# Patient Record
Sex: Male | Born: 1940 | Race: White | Hispanic: No | Marital: Single | Smoking: Current every day smoker
Health system: Southern US, Community
[De-identification: ages and names within clinical notes are randomized; demographics above are authoritative.]

---

## 2002-11-21 ENCOUNTER — Ambulatory Visit (HOSPITAL_COMMUNITY): Admission: RE | Admit: 2002-11-21 | Discharge: 2002-11-21 | Payer: Self-pay

## 2006-08-04 ENCOUNTER — Ambulatory Visit: Payer: Self-pay | Admitting: Internal Medicine

## 2006-08-04 ENCOUNTER — Inpatient Hospital Stay (HOSPITAL_COMMUNITY): Admission: EM | Admit: 2006-08-04 | Discharge: 2006-08-09 | Payer: Self-pay | Admitting: Emergency Medicine

## 2006-08-09 ENCOUNTER — Encounter (INDEPENDENT_AMBULATORY_CARE_PROVIDER_SITE_OTHER): Payer: Self-pay | Admitting: *Deleted

## 2006-08-09 ENCOUNTER — Ambulatory Visit: Payer: Self-pay | Admitting: Gastroenterology

## 2007-05-08 ENCOUNTER — Encounter: Admission: RE | Admit: 2007-05-08 | Discharge: 2007-06-19 | Payer: Self-pay | Admitting: Neurosurgery

## 2008-06-06 ENCOUNTER — Emergency Department (HOSPITAL_COMMUNITY): Admission: EM | Admit: 2008-06-06 | Discharge: 2008-06-06 | Payer: Self-pay | Admitting: Emergency Medicine

## 2008-06-28 ENCOUNTER — Encounter (INDEPENDENT_AMBULATORY_CARE_PROVIDER_SITE_OTHER): Payer: Self-pay | Admitting: Surgery

## 2008-06-29 ENCOUNTER — Encounter (INDEPENDENT_AMBULATORY_CARE_PROVIDER_SITE_OTHER): Payer: Self-pay | Admitting: *Deleted

## 2008-06-29 ENCOUNTER — Observation Stay (HOSPITAL_COMMUNITY): Admission: EM | Admit: 2008-06-29 | Discharge: 2008-06-30 | Payer: Self-pay | Admitting: Emergency Medicine

## 2009-04-10 ENCOUNTER — Encounter: Admission: RE | Admit: 2009-04-10 | Discharge: 2009-04-10 | Payer: Self-pay | Admitting: Neurosurgery

## 2009-09-30 ENCOUNTER — Encounter
Admission: RE | Admit: 2009-09-30 | Discharge: 2009-12-29 | Payer: Self-pay | Admitting: Physical Medicine & Rehabilitation

## 2009-10-06 ENCOUNTER — Ambulatory Visit: Payer: Self-pay | Admitting: Physical Medicine & Rehabilitation

## 2009-10-09 ENCOUNTER — Encounter
Admission: RE | Admit: 2009-10-09 | Discharge: 2010-01-05 | Payer: Self-pay | Admitting: Physical Medicine & Rehabilitation

## 2009-10-29 ENCOUNTER — Ambulatory Visit: Payer: Self-pay | Admitting: Physical Medicine & Rehabilitation

## 2009-11-08 ENCOUNTER — Ambulatory Visit (HOSPITAL_COMMUNITY)
Admission: RE | Admit: 2009-11-08 | Discharge: 2009-11-08 | Payer: Self-pay | Admitting: Physical Medicine & Rehabilitation

## 2009-12-03 ENCOUNTER — Ambulatory Visit: Payer: Self-pay | Admitting: Physical Medicine & Rehabilitation

## 2009-12-10 ENCOUNTER — Ambulatory Visit: Payer: Self-pay | Admitting: Physical Medicine & Rehabilitation

## 2009-12-16 ENCOUNTER — Encounter
Admission: RE | Admit: 2009-12-16 | Discharge: 2009-12-18 | Payer: Self-pay | Source: Home / Self Care | Admitting: Physical Medicine & Rehabilitation

## 2009-12-18 ENCOUNTER — Ambulatory Visit: Payer: Self-pay | Admitting: Physical Medicine & Rehabilitation

## 2010-01-08 ENCOUNTER — Encounter
Admission: RE | Admit: 2010-01-08 | Discharge: 2010-03-13 | Payer: Self-pay | Source: Home / Self Care | Attending: Physical Medicine & Rehabilitation | Admitting: Physical Medicine & Rehabilitation

## 2010-01-16 ENCOUNTER — Ambulatory Visit: Payer: Self-pay | Admitting: Physical Medicine & Rehabilitation

## 2010-03-03 ENCOUNTER — Encounter
Admission: RE | Admit: 2010-03-03 | Discharge: 2010-04-02 | Payer: Self-pay | Source: Home / Self Care | Attending: Physical Medicine & Rehabilitation | Admitting: Physical Medicine & Rehabilitation

## 2010-03-13 ENCOUNTER — Ambulatory Visit: Payer: Self-pay | Admitting: Physical Medicine & Rehabilitation

## 2010-04-12 ENCOUNTER — Encounter
Admission: RE | Admit: 2010-04-12 | Payer: Self-pay | Source: Home / Self Care | Admitting: Physical Medicine & Rehabilitation

## 2010-04-13 ENCOUNTER — Encounter
Admission: RE | Admit: 2010-04-13 | Discharge: 2010-05-12 | Payer: Self-pay | Source: Home / Self Care | Attending: Physical Medicine & Rehabilitation | Admitting: Physical Medicine & Rehabilitation

## 2010-04-14 ENCOUNTER — Ambulatory Visit
Admission: RE | Admit: 2010-04-14 | Discharge: 2010-04-14 | Payer: Self-pay | Source: Home / Self Care | Attending: Physical Medicine & Rehabilitation | Admitting: Physical Medicine & Rehabilitation

## 2010-05-14 ENCOUNTER — Encounter: Payer: Medicare Other | Attending: Physical Medicine & Rehabilitation

## 2010-05-14 ENCOUNTER — Ambulatory Visit: Admit: 2010-05-14 | Payer: Self-pay | Admitting: Physical Medicine & Rehabilitation

## 2010-05-14 ENCOUNTER — Ambulatory Visit: Payer: Medicare Other

## 2010-05-14 ENCOUNTER — Encounter: Payer: Medicare Other | Admitting: Physical Medicine & Rehabilitation

## 2010-05-14 DIAGNOSIS — M545 Low back pain, unspecified: Secondary | ICD-10-CM | POA: Insufficient documentation

## 2010-05-14 DIAGNOSIS — M47817 Spondylosis without myelopathy or radiculopathy, lumbosacral region: Secondary | ICD-10-CM | POA: Insufficient documentation

## 2010-06-24 ENCOUNTER — Ambulatory Visit: Payer: Medicare Other | Admitting: Physical Medicine & Rehabilitation

## 2010-06-24 ENCOUNTER — Encounter: Payer: Medicare Other | Attending: Physical Medicine & Rehabilitation

## 2010-06-24 DIAGNOSIS — M19019 Primary osteoarthritis, unspecified shoulder: Secondary | ICD-10-CM

## 2010-06-24 DIAGNOSIS — M545 Low back pain, unspecified: Secondary | ICD-10-CM | POA: Insufficient documentation

## 2010-06-24 DIAGNOSIS — M542 Cervicalgia: Secondary | ICD-10-CM

## 2010-06-24 DIAGNOSIS — M47817 Spondylosis without myelopathy or radiculopathy, lumbosacral region: Secondary | ICD-10-CM | POA: Insufficient documentation

## 2010-06-24 DIAGNOSIS — M7530 Calcific tendinitis of unspecified shoulder: Secondary | ICD-10-CM

## 2010-06-24 DIAGNOSIS — M5380 Other specified dorsopathies, site unspecified: Secondary | ICD-10-CM

## 2010-07-23 LAB — APTT: aPTT: 30 seconds (ref 24–37)

## 2010-07-23 LAB — BASIC METABOLIC PANEL
BUN: 5 mg/dL — ABNORMAL LOW (ref 6–23)
CO2: 26 mEq/L (ref 19–32)
Calcium: 8.8 mg/dL (ref 8.4–10.5)
Chloride: 102 mEq/L (ref 96–112)
GFR calc non Af Amer: >60 mL/min (ref >60)
Glucose, Bld: 171 mg/dL — ABNORMAL HIGH (ref 70–99)

## 2010-07-23 LAB — DIFFERENTIAL
Basophils Absolute: 0.2 K/uL — ABNORMAL HIGH (ref 0.0–0.1)
Basophils Relative: 2 % — ABNORMAL HIGH (ref 0–1)
Eosinophils Absolute: 0.4 K/uL (ref 0.0–0.7)
Eosinophils Relative: 3 % (ref 0–5)
Lymphocytes Relative: 13 % (ref 12–46)
Lymphs Abs: 1.6 10*3/uL (ref 0.7–4.0)
Monocytes Absolute: 1 10*3/uL (ref 0.1–1.0)
Monocytes Relative: 8 % (ref 3–12)
Neutro Abs: 8.7 K/uL — ABNORMAL HIGH (ref 1.7–7.7)
Neutrophils Relative %: 73 % (ref 43–77)

## 2010-07-23 LAB — BASIC METABOLIC PANEL WITH GFR
Creatinine, Ser: 0.76 mg/dL (ref 0.4–1.5)
GFR calc Af Amer: >60 mL/min (ref >60)
Potassium: 4.6 meq/L (ref 3.5–5.1)
Sodium: 133 meq/L — ABNORMAL LOW (ref 135–145)

## 2010-07-23 LAB — CBC
HCT: 36.2 % — ABNORMAL LOW (ref 39.0–52.0)
Hemoglobin: 12.8 g/dL — ABNORMAL LOW (ref 13.0–17.0)
MCHC: 35.4 g/dL (ref 30.0–36.0)
MCV: 100.5 fL — ABNORMAL HIGH (ref 78.0–100.0)
Platelets: 303 10*3/uL (ref 150–400)
RBC: 3.6 MIL/uL — ABNORMAL LOW (ref 4.22–5.81)
RDW: 13.8 % (ref 11.5–15.5)
WBC: 11.9 10*3/uL — ABNORMAL HIGH (ref 4.0–10.5)

## 2010-07-23 LAB — PROTIME-INR
INR: 0.9 (ref 0.00–1.49)
Prothrombin Time: 12.2 s (ref 11.6–15.2)

## 2010-08-03 ENCOUNTER — Encounter: Payer: Medicare Other | Attending: Physical Medicine & Rehabilitation

## 2010-08-03 ENCOUNTER — Ambulatory Visit: Payer: Medicare Other | Admitting: Physical Medicine & Rehabilitation

## 2010-08-03 DIAGNOSIS — M47817 Spondylosis without myelopathy or radiculopathy, lumbosacral region: Secondary | ICD-10-CM | POA: Insufficient documentation

## 2010-08-04 NOTE — Procedures (Signed)
Andres Cochran, UMSCHEID              ACCOUNT NO.:  [id number]  MEDICAL RECORD NO.:  [id number]           PATIENT TYPE:  O  LOCATION:  [facility]PC                          FACILITY:  [facility]  PHYSICIAN:  Erick Colace, M.D.DATE OF BIRTH:  Aug 14, 1940  DATE OF PROCEDURE:  08/03/2010 DATE OF DISCHARGE:                              OPERATIVE REPORT  PROCEDURE:  left L3 and left L4 medial branch blocks and left L5 dorsal ramus injection to block the L4-5, L5-S1 facet joints under fluoroscopic guidance.  INDICATION:  Lumbar spondylosis with very good relief following right- sided medial branch blocks May 31, 2010, now here for left-sided injections.  Informed consent was obtained after describing the risks and benefits of the procedure with the patient.  These include bleeding, bruising, and infection.  The patient has failed conservative care including medication management and elects to proceed.  The patient was placed prone on fluoroscopy table.  Betadine prep, sterile drape, 25-gauge inch and half needle was used to anesthetize the skin and subcutaneous tissue with 1% lidocaine x2 mL at each of 3 sites and a 22-gauge 3-1/2-inch spinal needle was inserted under fluoroscopic guidance starting at left S1 SAP sacral ala junction, bone contact made. Omnipaque 180 x0.5 mL demonstrated no intravascular uptake, then 0.5 mL of dexamethasone and lidocaine solution was injected.  Then, left L5 SAP transverse process junction targeted, bone contact made.  Omnipaque 180 x0.5 mL demonstrated no intravascular uptake, then 0.5 end of solution containing 1 mL of 4 mg/mL dexamethasone and 2 mL of 2% lidocaine were injected.  The left L4 SAP transverse process junction targeted, bone contact made.  Omnipaque 180 x0.5 mL demonstrated no intravascular uptake, then 0.5 mL of dexamethasone and lidocaine solution was injected.  The patient tolerated the procedure well.  Pre and postinjection vitals stable.   Postinjection structures were given. Preinjection pain level 8 and postinjection 4/10.  The patient tolerated the procedure well.  Postprocedure vitals stable.  Follow up with Dr. Riley Kill in 1 month.     Erick Colace, M.D. Electronically Signed    AEK/MEDQ  D:  08/03/2010 14:16:06  T:  08/04/2010 01:10:00  Job:  [id number]

## 2010-08-10 NOTE — Assessment & Plan Note (Signed)
Andres Cochran is back regarding his lumbar facet arthropathy and general back pain.  He had good results on the right side of the L4-L5, L5-S1 medial branch blocks.  He is having more left-sided pain now.  He woke up other day with the back tight and catching.  He rested for a couple of days and felt a bit better but still having more left-sided symptoms when he stands.  It hurts him to bear weight sometimes on the left side as well. Pain is 4-6/10.  He is using Percocet for breakthrough pain.  He used "a few extra." His pain increased.  Pain is sharp burning, tingling, aching.  Pain interferes with general activity, relations with others, enjoyment of life on a moderate level.  REVIEW OF SYSTEMS:  Notable for the above.  He still has persistent weight loss and his family doctor has not found the reason for this yet. Appetite is poor at times.  He has some night sweats, elevated sugars. Full 12 point review is in the written health history section of the chart.  SOCIAL HISTORY:  Unchanged, living with his sisters and he is still smoking.  PHYSICAL EXAMINATION:  VITAL SIGNS:  Blood pressure is 153/84, pulse 96, respiratory rate 18, satting 99% on room air. GENERAL:  The patient is pleasant, alert and oriented x3. NEUROLOGIC:  Affect showing bright and appropriate.  He states he had some pain on the left side and some mild antalgia with left weightbearing.  He had pain with extension, lateral bending, and facet maneuvers were positive on the right much more on the left.  He is able to really comfortably flex today to 90 degrees.  Strength is 5/5 with some distal sensory loss in the legs.  He continues to be quite small in build and it is hard to discern whether he lost any further weight quite frankly.  He otherwise is alert and appropriate.  ASSESSMENT: 1. Lumbar spondylosis with S1 radiculitis and L5 radiculitis in the     right.  This has improved. 2. Lumbar facet arthropathy, right  greater than left at L4-L5, L5-S1.     The patient with excellent results after right medial branch     blocks. 3. Greater trochanter bursitis. 4. Diabetes. 5. History of insomnia, tobacco abuse, and caffeine abuse.  PLAN: 1. We will send the patient for medial branch blocks L4, L5-S1 on the     left per Dr. Fritzi Mandes as available.  Again we stressed the     importance of appropriate posture, quad muscle strengthening,     lengthening, etc. 2. We will add Robaxin p.r.n. for spasms and I think he occasionally     he is having breakthrough spasm symptoms. 3. I refilled his Percocet today 5/325 one q.6 h. #90. 4. I will see him back pending injections.     Ranelle Oyster, M.D. Electronically Signed    ZTS/MedQ D:  06/24/2010 10:11:19  T:  06/24/2010 13:14:06  Job #:  [id number]  cc:   Raynelle Jan, M.D. Fax: [phone]  Clydene Fake, M.D. Fax: [phone]

## 2010-08-14 ENCOUNTER — Encounter: Payer: Medicare Other | Attending: Physical Medicine & Rehabilitation | Admitting: Neurosurgery

## 2010-08-14 DIAGNOSIS — M47817 Spondylosis without myelopathy or radiculopathy, lumbosacral region: Secondary | ICD-10-CM | POA: Insufficient documentation

## 2010-08-14 DIAGNOSIS — M48061 Spinal stenosis, lumbar region without neurogenic claudication: Secondary | ICD-10-CM

## 2010-08-14 DIAGNOSIS — G894 Chronic pain syndrome: Secondary | ICD-10-CM

## 2010-08-14 DIAGNOSIS — M545 Low back pain, unspecified: Secondary | ICD-10-CM | POA: Insufficient documentation

## 2010-08-14 DIAGNOSIS — M5430 Sciatica, unspecified side: Secondary | ICD-10-CM

## 2010-08-15 NOTE — Assessment & Plan Note (Signed)
Mr. Ingle is a patient of Dr. Faith Rogue that he has followed for sometime for low back pain.  He has most recently been seen by Dr. Wynn Banker for left L3-4 medial branch blocks and the left L5 dorsal ramus injection that was completed on August 02, 2009.  The patient comes in today.  Pain complaints on an average of 10.  It is sharp, burning, dull, stabbing, tingling, aching, and some constant pain.  He states pain has been worse since he got the injection.  He reports low back pain with left radicular pain.  The pain is worse with all activities.  Heat, rest, therapy, exercise, medications, and injections tend to help, but he states the injections he feels like made it worse this past time.  The patient's mobility is poor.  He does walk without assistance.  He has a cane at times, he states, but not today. Does have wounds on his arms from falling.  He lives alone.  He does most of his own ADLs.  He is on disability.  REVIEW OF SYSTEMS:  Notable for skin rash and breakdown, easy bleeding, high blood sugars, night sweats, painful urination, constipation, poor appetite, and limb swelling.  He also has numbness, trouble with ambulation, confusion, and depression from time to time.  The patient's blood pressure today is 123/80, his pulse was 88, respirations were 24, his O2 sat was 91 on room air.  His motor strength appears about 4/5 in the lower extremities bilaterally.  Sensation is intact to pinprick.  He appears with somewhat depressed.  He is alert and oriented x3.  Constitutionally, he is within normal limits.  ASSESSMENT: 1. Lumbar stenosis. 2. Chronic low back pain. 3. Status post medial branch blocks and dorsal ramus injections     without positive affect.  PLAN: 1. He will follow up as scheduled with Dr. Riley Kill on Sep 02, 2010. 2. The patient did not bring his pills with him today that he did     retrieve bottle which was empty.  He is 7 days early for his  refill. 3. I did give him prescription for oxycodone 5/325 one p.o. q.6 h.     p.r.n. pain, #90 with no refill, cannot get it refilled until Aug 21, 2010.  He understands this. 4. Otherwise, there are no signs of aberrant behavior and again he     will follow up with Dr. Riley Kill on Sep 01, 2010, or Sep 02, 2010, as     scheduled.     Briggs Edelen L. Blima Dessert    RLW/MedQ D:  08/14/2010 14:40:23  T:  08/15/2010 02:15:55  Job #:  [id number]

## 2010-08-17 ENCOUNTER — Emergency Department (HOSPITAL_COMMUNITY): Payer: Medicare Other

## 2010-08-17 ENCOUNTER — Emergency Department (HOSPITAL_COMMUNITY)
Admission: EM | Admit: 2010-08-17 | Discharge: 2010-08-17 | Discharge status: Against Medical Advice | Payer: Medicare Other | Attending: Emergency Medicine | Admitting: Emergency Medicine

## 2010-08-17 DIAGNOSIS — Z043 Encounter for examination and observation following other accident: Secondary | ICD-10-CM | POA: Insufficient documentation

## 2010-08-17 LAB — BASIC METABOLIC PANEL
BUN: 10 mg/dL (ref 6–23)
CO2: 26 mEq/L (ref 19–32)
Calcium: 8.5 mg/dL (ref 8.4–10.5)
Potassium: 4.4 mEq/L (ref 3.5–5.1)

## 2010-08-17 LAB — CBC
Platelets: 231 10*3/uL (ref 150–400)
RBC: 3.52 MIL/uL — ABNORMAL LOW (ref 4.22–5.81)
RDW: 13.6 % (ref 11.5–15.5)
WBC: 9.7 10*3/uL (ref 4.0–10.5)

## 2010-08-17 LAB — DIFFERENTIAL
Basophils Absolute: 0.1 10*3/uL (ref 0.0–0.1)
Basophils Relative: 1 % (ref 0–1)
Eosinophils Absolute: 0.8 10*3/uL — ABNORMAL HIGH (ref 0.0–0.7)
Lymphocytes Relative: 26 % (ref 12–46)
Monocytes Relative: 9 % (ref 3–12)
Neutro Abs: 5.5 10*3/uL (ref 1.7–7.7)
Neutrophils Relative %: 56 % (ref 43–77)
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
## 2010-08-25 NOTE — Op Note (Signed)
NAMEARMARION, GREEK NO.:  [id number]   MEDICAL RECORD NO.:  [id number]          PATIENT TYPE:  OBV   LOCATION:  1524                         [facility]ACILITY[facility]   PHYSICIAN:  Ardeth Sportsman, MD     DATE OF BIRTH:  1940-11-12   DATE OF PROCEDURE:  DATE OF DISCHARGE:                               OPERATIVE REPORT   PRIMARY CARE PHYSICIAN:  Dr. Joetta Manners near in [place], [place].   GASTROENTEROLOGIST:  Dr. Lynann Bologna.   SURGEON:  Ardeth Sportsman, MD.   PREOPERATIVE DIAGNOSIS:  Prolapsed necrotic hemorrhoids.   POSTOPERATIVE DIAGNOSES:  1. Right posterior prolapsed necrotic internal and external      hemorrhoid.  2. Left lateral prolapsing non-necrotic internal hemorrhoid.   PROCEDURE PERFORMED:  Examination under anesthesia  Hemorrhoidectomy x2 (right posterior and left lateral).   ANESTHESIA:  1. General anesthesia.  2. Bilateral anorectal block using quarter-percent bupivacaine.   DRAINS:  None.   ESTIMATED BLOOD LOSS:  30 mL.   COMPLICATIONS:  None apparent.   INDICATIONS:  Mr. Nevins is a 70 year old gentleman with numerous health  issues.  He has been struggling with chronic hemorrhoids and had a  hemorrhoidectomy in the past.  He had a recent colonoscopy which was  unable to be completed, and starting having worsening pain and swelling  and prolapsing of his hemorrhoids.  He had, had intermittent prolapsing  for the past year, but after the colonoscopy they came out and stayed  out and were severely tender and uncomfortable.  It came to the point of  being unbearable and he could no longer reduce it and, therefore, he  came to the emergency room.  Dr. Oletta Lamas requested surgical consultation  given the fact that it was large and out, extremely painful and not able  to be reduced with some evident necrosis.  Recommendation was made for  examination under anesthesia.  The risks, benefits and alternatives were  discussed.  The technique of  removal of hemorrhoids was discussed.  Questions answered and he and his family agreed to proceed.   OPERATIVE FINDINGS:  He had a right posterior extremely large hemorrhoid  with an internal and external component with a wide basethat was  prolapsed and strangulated.  He had a smaller, but definite left lateral  prolapsing hemorrhoid as well.  He had normal sphincter tone without  stricture.  He had no obvious rectal masses.   DESCRIPTION OF PROCEDURE:  Informed consent was confirmed.  The patient  received IV cefoxitin given the urgent nature of the case.  He had  sequential compression devices active during the entire case.  He  underwent general anesthesia without any difficulty.  He was positioned  in high lithotomy.  He had a Foley catheter sterilely placed.  His  perineum and perianal region were prepped and draped in a sterile  fashion.   Digital examination revealed findings noted above with obvious large  prolapsing hemorrhoid in the right posterior aspect.  He had a more  moderate one on the left lateral aspect.  There was no evidence of any  major prolapsing right anteriorly.  I was able to carefully dilate two  fingers up and get an anoscope in.  Therefore, I used cautery dissection  to excise the large right posterior hemorrhoid, taking care to avoid  taking too wide of a margin right at the anal sphincter level.  I ended  up reapproximating the defect using interrupted figure-of-eight chromic  stitches horizontally to the rectum to the anal skin edge to good  result.   Re-examination revealed the obvious prolapsing hemorrhoid on the left  lateral side.  I went ahead and put a 2-0 chromic stitch at the apex of  the hermorrhoid, excised that with cautery and closed it in a running  fashion with a more classic longitudinal fashion to good result.  Hemostasis was excellent.  There was an internal component.  I did not  have to excise any external region.   Digital  examination revealed intact sphincter and there was no evidence  of any stenosis.  Hemostasis was excellent.  I placed a B and O  suppository and a large Gelfoam within the anal region.  The patient was  extubated and sent to the recovery room in stable condition.   There was no family to discuss postoperative care at this time, but I  will try and locate them later this morning since it is the middle of  the night.  Postop instructions were discussed and are on the chart as  well, and I will follow-up with the patient later.      Ardeth Sportsman, MD  Electronically Signed     SCG/MEDQ  D:  06/29/2008  T:  06/29/2008  Job:  [id number]   cc:   Raynelle Jan, M.D.  Fax: [phone]   Lynann Bologna  Fax: [phone]

## 2010-08-28 NOTE — Discharge Summary (Signed)
NAMESALMAN, WELLEN NO.:  [id number]   MEDICAL RECORD NO.:  [id number]          PATIENT TYPE:  INP   LOCATION:  5525                         FACILITY:  [facility]   PHYSICIAN:  Alvester Morin, M.D.  DATE OF BIRTH:  1940-07-28   DATE OF ADMISSION:  08/04/2006  DATE OF DISCHARGE:  08/09/2006                               DISCHARGE SUMMARY   DISCHARGE DIAGNOSES:  1. Abdominal pain secondary to chronic pancreatitis.  2. History of peptic ulcer disease.  3. History of acalculous cholecystitis.  4. Diabetes mellitus, type 2.  5. 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  6. History of pancreatic ductal dilatation.  7. History of alcohol abuse.  8. Tobacco abuse.  9. Gastroesophageal reflux disease.   DISCHARGE MEDICATIONS:  1. Pancrease/Creon, take 2 capsules p.o. three times a day with and/or      before meals.  2. Amaryl 2 mg one tablet p.o. daily.  3. Percocet 5/325, 1-2 tablets p.o. q.6h. p.r.n. pain, dispense 75      with no refills.  4. Wellbutrin 150 mg p.o. once daily.  5. Prilosec 20 mg one tablet p.o. daily.   DISPOSITION AND FOLLOW UP:  Mr. Manna is being discharged from [facility] in stable condition.  He continues to have episodic  periods of abdominal pain which are consistent with his chronic  abdominal pain.  His pain is greatly reduced, and he has been  consistently able to take p.o.  He is to follow up first with his  primary care physician, Dr. Carolyne Fiscal, on Thursday, Aug 11, 2006 at 9:45 a.m.  At that point, Dr. Carolyne Fiscal can evaluate the patient's abdominal pain, as  well as his current pain control.  Again, I am discharging with no  refills for some Percocet tablets, 75 given.  I have also instructed the  patient to vary the timing of his Pancrease self administration in order  to maximize its benefits to limit his post prandial abdominal pain.  In  addition, she will continue to follow him for his chronic medical  conditions including his chronic pancreatitis, diabetes and alcoholism.  In addition, the patient has been discharged with a starting dose of  Wellbutrin 150 mg once daily for smoking cessation reasons.  This can be  titrated up to b.i.d. during that visit with Dr. Carolyne Fiscal.   Following this, the patient will be evaluated by Dr. Lindie Spruce of [facility].  The patient is very interested in having a  cholecystectomy.  We did not think that this was warranted as an  inpatient in the setting of acute-on-chronic pancreatitis; however,  I  will defer to Dr. Lindie Spruce in terms of the patient's suitability for  cholecystectomy.  Also to Dr. Lindie Spruce, I am faxing over a large amount of  the patient's past medical records from [facility] and several of  his pertinent imaging findings.  I will also describe a summary of them  below.   PROCEDURES PERFORMED:  1. Acute abdominal series the day of admission revealed a nonspecific      bowel gas  pattern with a mild gaseous distention of the small bowel      and colon.  It also picked up COPD changes in the lung.  2. Abdominal ultrasound revealed a sludge ball in the gallbladder.  A      common duct dilatation of 10 mm with high suspicion for a 4 mm      distal common duct stone.  The pancreas and spleen were      suboptimally visualized.  3. MRCP showed mild pancreatic ductal dilatation and dilatation of the      pancreatic duct side branches consistent with chronic pancreatitis.      A smoothly tapered stricture of the distal enteropancreatic portion      of the common bile duct, likely due to chronic pancreatitis.      Proximal dilatation of the common bile duct measuring 11 mm.  No      evidence of choledocholithiasis or pancreatic mass.   CONSULTATIONS:  Dr. Russella Dar of gastroenterology.   BRIEF ADMISSION HISTORY AND PHYSICAL:  Mr. Cassetta is a 70 year old man  with a history of recurrent chronic pancreatitis, diabetes secondary to   pancreatitis and history of alcoholism who presents with abdominal pain.  The pain had started 5 days prior to admission and was 4/10 in nature,  dull, midline and fairly stable.  He did not seek any medical help;  however, 1 day prior to admission the pain became severe, approximately  10/10, sharp, all over the abdomen, associated with nausea and vomiting  approximately 7-8 days with non-bloody emesis.  It is similar to his  previous episodes of pancreatitis, he reports.  He denied fever, chills  or jaundice.  He complained of some weight loss, approximately 10 pounds  over the prior 5-6 months.  He had apparently been seen at [facility] in February of 2008 for recurrent pancreatitis with an  extensive work-up.   ALLERGIES:  CODEINE which causes a rash.   PAST MEDICAL HISTORY:  1. Chronic pancreatitis.  Has had at least 10 exacerbations in the      past with the most recent being in February of 2008 and then April      of 2008.  He has had multiple CTs which revealed intra and      extrahepatic, as well as intrapancreatic ductal dilatation.      Apparently, he did have some calcifications and other stigmata of      chronic pancreatitis visualized, as well.  At one point in February      of 2008, a 1.8 cm pancreatic pseudocyst had been visualized.  Then      in March of 2008, a [facility] CT revealed detailed cystic      components compared with the prior exam and continued prominence of      the pancreatic duct.  2. Cholecystitis.  He has apparently had 2 ultrasound and MRCPs in the      past, apparently in February of 2008.  This was consistent with      acalculous cholecystitis with no definite stones.  The common bile      duct was noted to be dilated in February of 2008 to 8-9 mm.  He      underwent MRCP on May 21, 2006 at [place] that showed      pancreatic enlargement with common bile duct dilatation to 11 mm,     with a stricture versus extrinsic  compression of the common bile  duct within the pancreatic head.  Again, in February of 2008, he      underwent ultrasound and MRCP here at [facility] as described above      under procedures.  3. Peptic ulcer disease.  The patient has undergone at least 3 EGDs      that were documented in the past with coagulation of ulcers in the      past.  In January of 2006, he was noted to have a small hiatal      hernia with moderate gastritis.  In May of 2007 at [place], again      they visualized a hiatal hernia with no recurrent ulcers.  He had a      deformed pylorus suggestive of former peptic ulcer disease.   ADMISSION PHYSICAL EXAMINATION:  VITAL SIGNS:  Temperature of 97.6,  blood pressure 133/78, heart rate of 90, respiratory rate of 18, O2  saturations of 98-100% on room air.  GENERAL:  This is a very thin, pale, elderly gentleman who is alert, in  mild distress.  HEENT:  Eyes - pupils equal, round and reactive to light.  Extraocular  muscles intact.  Sclerae were anicteric.  Oropharynx was dry.  NECK:  Supple with no JVD.  LUNGS:  Clear to auscultation bilaterally with no wheezing, rhonchi or  rales.  CARDIOVASCULAR:  Regular rate and rhythm.  S1 and S2.  No murmurs, rubs,  or gallops appreciated.  ABDOMEN:  He had active bowel sounds, diffusely tender with active  voluntary guarding.  He has right upper quadrant tenderness; no rebound  and no organomegaly appreciated.  EXTREMITIES:  No edema.  SKIN:  Diffusely dry.  NEUROLOGIC:  He was alert and oriented x3.  He had no focal deficits in  strength or sensation.  PSYCHIATRIC:  He was appropriate.   LABORATORY DATA:  Admission labs revealed a sodium of 131, potassium  4.1, chloride 95, bicarbonate 26, BUN 10, creatinine 0.82, glucose of  117, white count of 13.8, hemoglobin of 13.7, platelets of 370,  bilirubin of 0.6, alkaline phosphatase of 77, AST 21, ALT 11, protein  7.6, albumin of 3.9, calcium of 9.4.  A urinalysis was  negative.  A  lipase was 35.  We did an abdominal ultrasound and plain film as  described above.   HOSPITAL COURSE:  1. Abdominal pain secondary to acute-on-chronic pancreatitis.  The      patient was admitted for further work-up of his abdominal pain.      The ultrasound suggested the possibility of a common bile duct      stone.  However, GI consultation weighed in on the matter and felt      that choledocholithiasis was unlikely as LFT's were normal.  They      felt that his presentation was consistent with chronic pancreatitis      with a normal lipase in the setting of chronic alcohol abuse.  They      did recommend proceeding with an MRCP.  Again, these were with the      results as described above.  The patient was kept NPO during the      aforementioned work-up; however, gradually his pain improved.  He      required decreasing amounts of pain medications.  We slowly      advanced his diet to clear liquids.  He tolerated this moderately     well.  On the morning of discharge, he did have some postprandial  pain from a full clears breakfast; however, he says this is      consistent with his past pain.  At this point, we are discharging      the patient with p.r.n. pain medications, as well as his pancreatic      replacement enzymes, with specific instructions for the patient to      figure out the most efficacious way to administer pancreas (i.e. if      it is 20, 30 or 15 minutes before food or with food or breaking the      capsules over the food).  Again, at this point, he has had a very      thorough GI work-up here, as well as in the past as described      above, and his pain seems consistent with chronic pancreatitis.      however, the patient insists that he thinks something can be done.      For this reason, our medical student, Anabel Halon, who has worked      very thoroughly in following the patient, as well as teasing out      the above-mentioned past medical  history, has set him up to see Dr.      Lindie Spruce of [facility] on Aug 19, 2006.   1. Diabetes mellitus.  The patient is not really clear on how long he      has had diabetes, and was actually somewhat unaware that he had      diabetes.  He felt that Amaryl was a pancreatic medication.      Regardless, we maintained him on Amaryl, which afforded adequate      blood sugar control when the patient was taking p.o. on a regular      basis.  We discharged him on the same to follow up with Dr. Carolyne Fiscal      for further management of his diabetes.   1. Alcoholism.  The patient does seem to be in some form of denial,      feeling that beer was not true alcohol.  Our attending physician,      as well as Anabel Halon, have counseled the patient in regards to      alcohol use.  Anabel Halon has actually also referred him to      Alcoholics Anonymous for further outpatient management of this      problem.   1. Tobacco abuse.  The patient was encouraged and counseled on      quitting.  He was actually requesting a prescription for Wellbutrin      for smoking cessation.  I have provided him with this, and he      should be on 150 mg once daily for 3 days and this can then be      titrated up to b.i.d.  Again, I will refer to Dr. Carolyne Fiscal.   DISCHARGE LABORATORIES AND VITALS:  On the day of discharge, his  temperature was 98, blood pressure 115/66, heart rate 60, respiratory  rate of 18, O2 saturation of 98% on room air.  White blood cell count of  7.8,  hemoglobin 11.6, platelets 283.  Sodium 133, potassium 3.6, chloride  100, bicarbonate 26, BUN 3, creatinine 0.9.  Hemoglobin A1c is 5.8,  alcohol level less than 5.  Urinalysis was negative for infection.  Fecal occult blood was negative.      Antony Contras, M.D.  Electronically Signed      Harriett Sine  Lyn Hollingshead, M.D.  Electronically Signed    GL/MEDQ  D:  08/09/2006  T:  08/09/2006  Job:  [id number]   cc:   Raynelle Jan, M.D.  Cherylynn Ridges, M.D. Venita Lick. Russella Dar, MD, Clementeen Graham

## 2010-09-01 ENCOUNTER — Encounter: Payer: Medicare Other | Attending: Physical Medicine & Rehabilitation | Admitting: Physical Medicine & Rehabilitation

## 2010-09-01 DIAGNOSIS — M542 Cervicalgia: Secondary | ICD-10-CM

## 2010-09-01 DIAGNOSIS — M19019 Primary osteoarthritis, unspecified shoulder: Secondary | ICD-10-CM

## 2010-09-01 DIAGNOSIS — M545 Low back pain, unspecified: Secondary | ICD-10-CM | POA: Insufficient documentation

## 2010-09-01 DIAGNOSIS — M79609 Pain in unspecified limb: Secondary | ICD-10-CM | POA: Insufficient documentation

## 2010-09-01 DIAGNOSIS — M129 Arthropathy, unspecified: Secondary | ICD-10-CM | POA: Insufficient documentation

## 2010-09-01 DIAGNOSIS — IMO0002 Reserved for concepts with insufficient information to code with codable children: Secondary | ICD-10-CM

## 2010-09-01 DIAGNOSIS — M48061 Spinal stenosis, lumbar region without neurogenic claudication: Secondary | ICD-10-CM | POA: Insufficient documentation

## 2010-09-01 DIAGNOSIS — M5380 Other specified dorsopathies, site unspecified: Secondary | ICD-10-CM

## 2010-09-02 NOTE — Assessment & Plan Note (Signed)
Andres Cochran is back regarding his back pain.  Still reporting more pain in the left leg at this point, occasionally the back.  Has a whole of his right leg pain and back pain is improved.  He came back early, see our nurse practitioner with increased pain than the left side.  It seems to bother him a lot at night.  He does not feel that the injections that Dr. Wynn Banker performed which were medial branch blocks on the left or is beneficial as they had been previously.  Pain rates from 6-9/10.  He states that the left leg felt like his right leg.  He would be fairly satisfied of his pain control levels.  REVIEW OF SYSTEMS:  Notable for numbness, trouble walking, spasm, dizziness, tingling, confusion, depression.  He reports constipation, poor appetite, limb swelling, easy bleeding, night sweats.  Full 12- point review is in the written health and history section of the chart.  SOCIAL HISTORY:  Unchanged.  PHYSICAL EXAMINATION:  VITAL SIGNS:  Blood pressure is 150/68, pulse 84, respiratory rate 18, he is satting 99% on room air. GENERAL:  The patient is pleasant, alert, and oriented x3.  He remains slight of build.  He is alert and appropriate. HEART:  Regular rate. CHEST:  Clear. ABDOMEN:  Soft, nontender. MUSCULOSKELETAL:  He has intact sensation to the left leg with positive straight leg raise and seated slump test today.  Back is somewhat tender to palpation over the left lower lumbar region, but really away from the spine into the upper gluteal area.  He has pain more with flexion today than extension, although extends and did cause discomfort, but not radiation of his pain.  Strength is grossly 4-5/5 in the lower extremities with normal sensation.  ASSESSMENT: 1. Lumbar stenosis with facet arthropathy. 2. History of lumbar disk disease with likely radiculopathy.  Symptoms     more predominant in the left than the right leg at this point.  PLAN: 1. The patient did not need pain  medications today.  He was reminded     to comply with the written schedule of his percocet prescription. 2. We will increase Pamelor to 25 mg 1-2 q.h.s. for neuropathic     symptoms of the left leg. 3. We will send the patient to Dr. Wynn Banker for a left paramedian     translaminar injection L4-L5. 4. I will see him back pending the injection above.     Ranelle Oyster, M.D. Electronically Signed    ZTS/MedQ D:  09/01/2010 12:02:24  T:  09/02/2010 01:07:25  Job #:  [id number]

## 2010-09-18 IMAGING — CR DG CERVICAL SPINE FLEX&EXT ONLY
3 series · 3 of 3 positions shown · non-contrast
Comparison: CT scan same day

CLINICAL DATA: Fall.  Pain.

CERVICAL SPINE - FLEXION AND EXTENSION VIEWS ONLY

[w c-spine lat *]
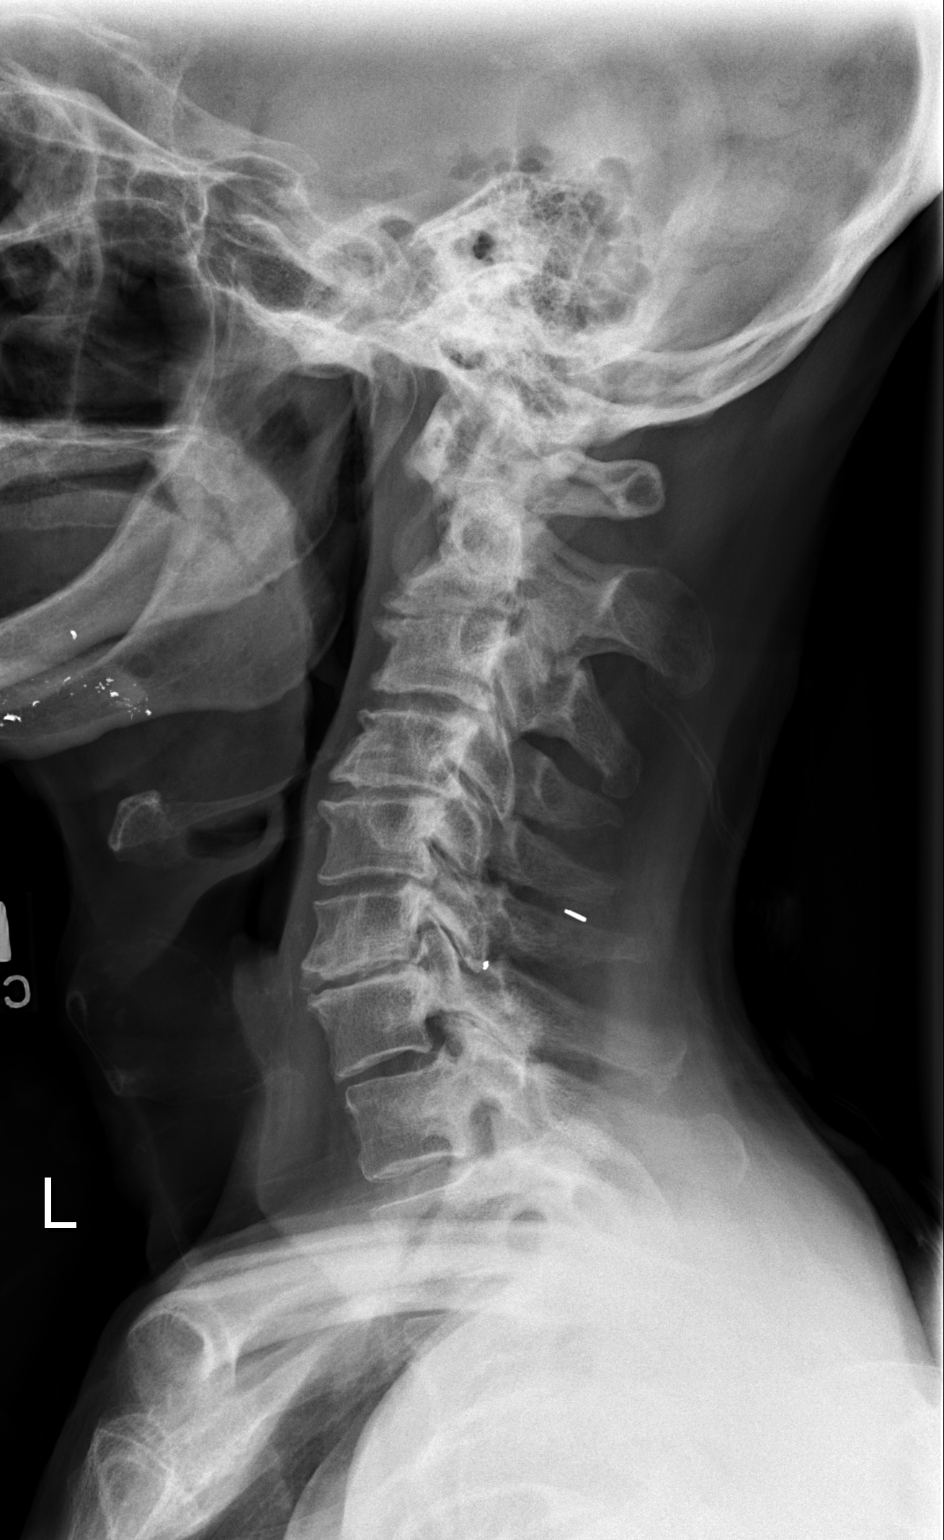

[w c-spine flexion *]
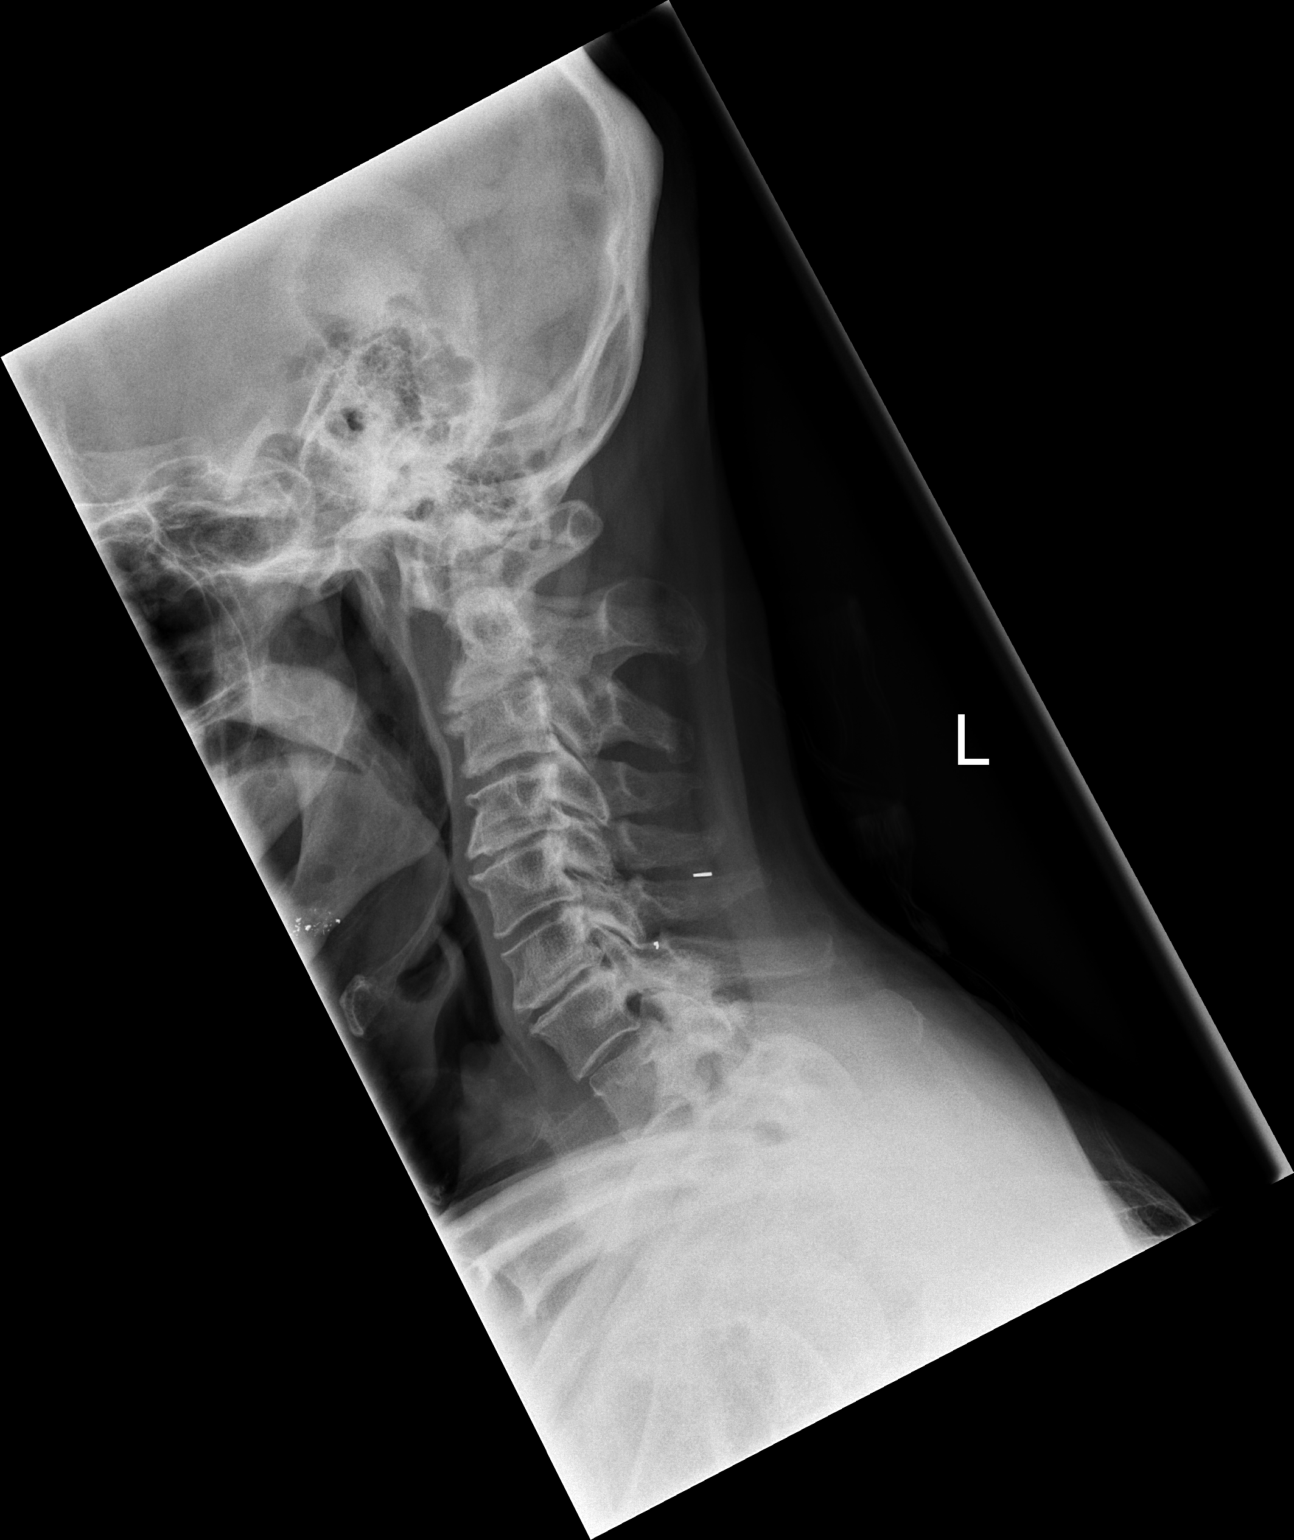

[w c-spine extension *]
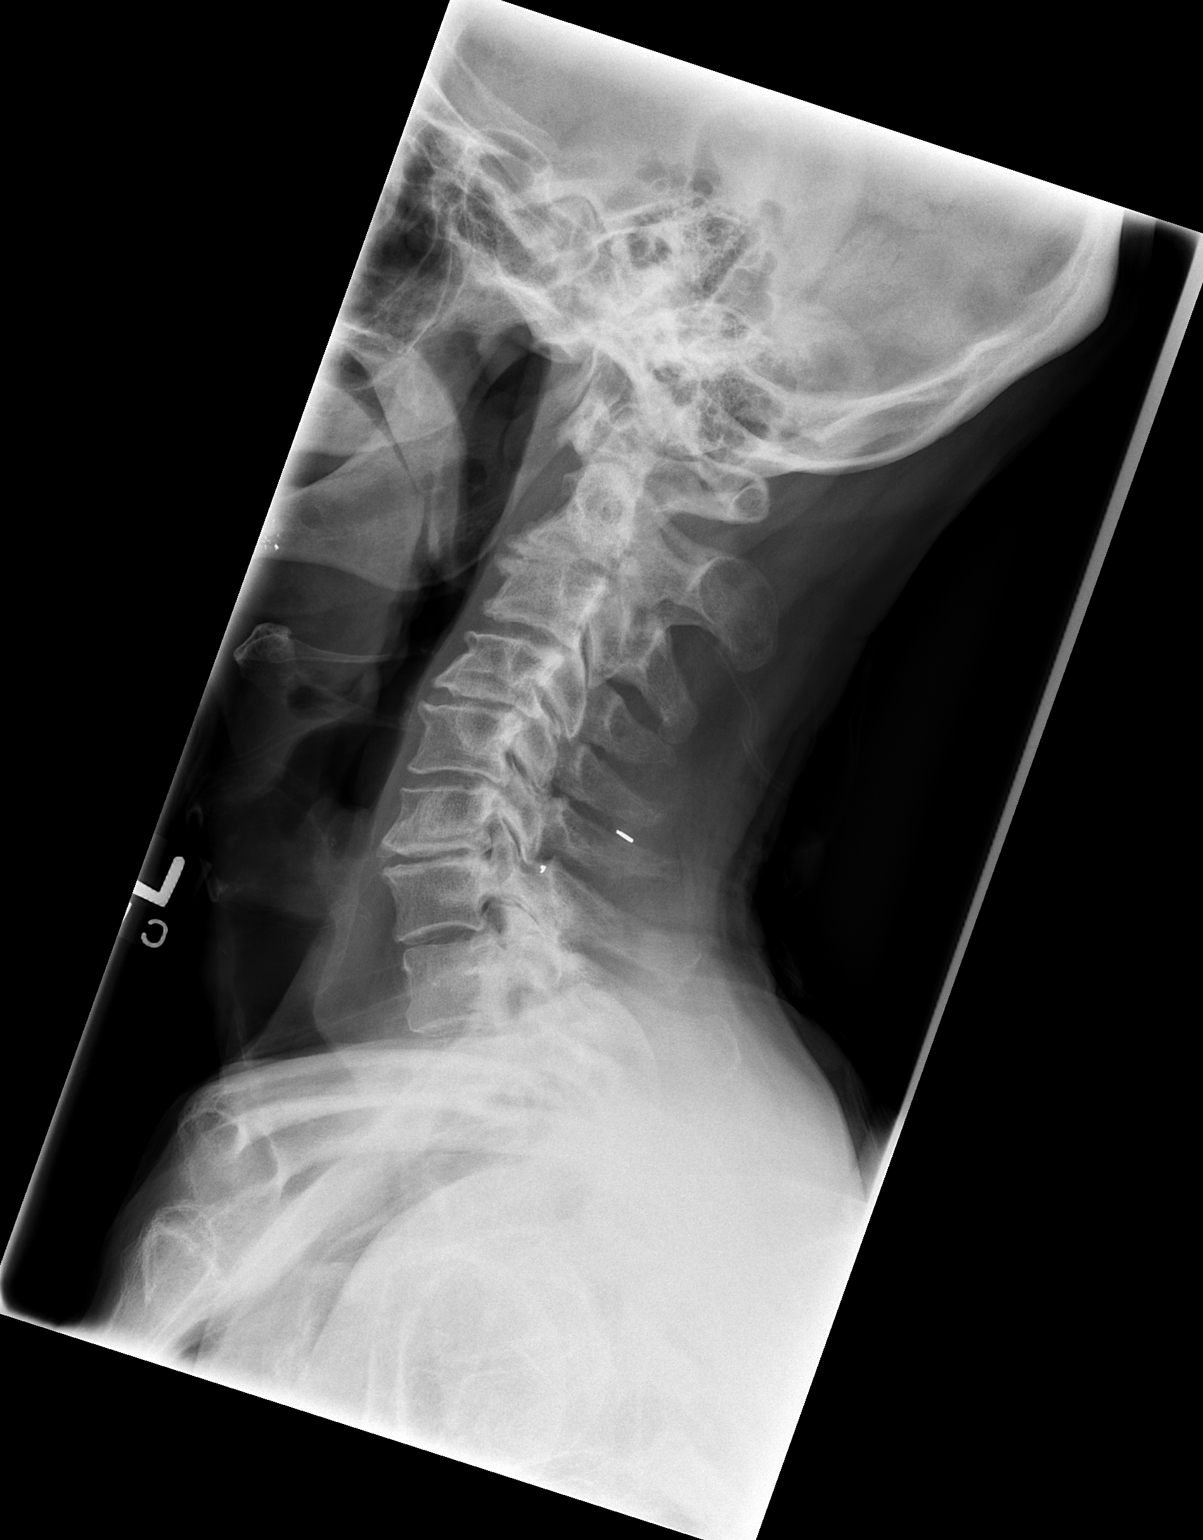

[3 of 3 positions shown; findings below may reference images not displayed]

FINDINGS: Alignment is normal in the neutral position.  Flexion and
extension does not show any subluxation.  No abnormal rocking
motion.  Spondylosis and facet arthropathy again noted.
IMPRESSION: Chronic degenerative changes.  No abnormal motion.

## 2010-09-18 IMAGING — CT CT HEAD W/O CM
2 of 4 series · 13 of 40 positions shown, 16 images · non-contrast
Comparison: None.

CT HEAD

CLINICAL DATA: 67-year-old male status post fall down stairs with
neck pain and posterior headache.

CT HEAD WITHOUT CONTRAST
CT CERVICAL SPINE WITHOUT CONTRAST
TECHNIQUE: Multidetector CT imaging of the head and cervical spine
was performed following the standard protocol without IV contrast.
Multiplanar CT image reconstructions of the cervical spine were
also generated.

[Series 602: <mpr thick range> · coronal · 0.37mm/px · 3 of 43 slices shown]
[im 15/43  brain]
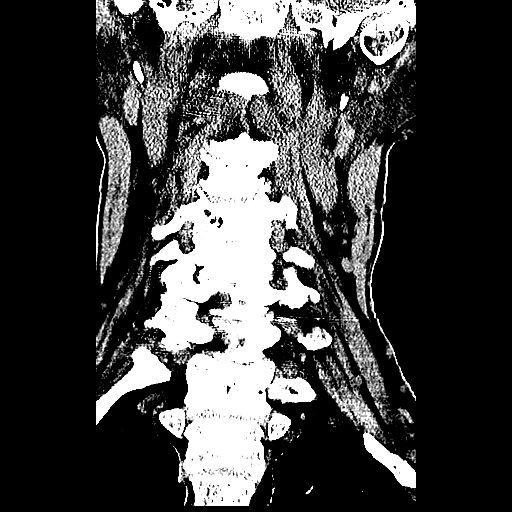
[im 19/43  brain]
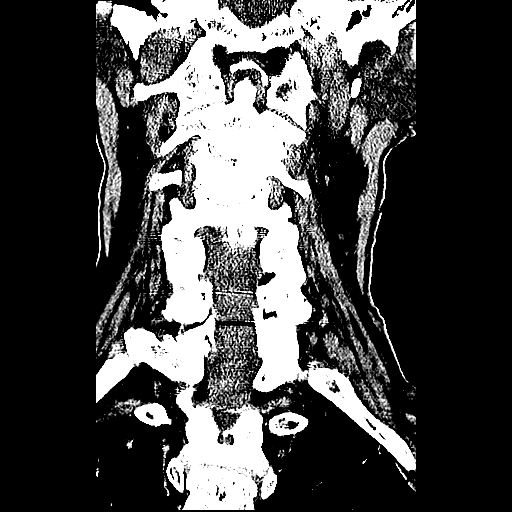
[im 24/43  brain]
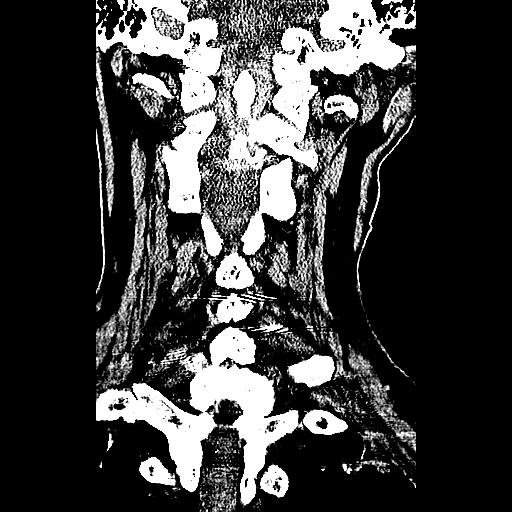

[Series 603: <mpr thick range(1)> · axial · 0.37mm/px · z∈[-324,-177]mm · 10 of 91 slices shown, 13 images]
[im 9/91  brain]
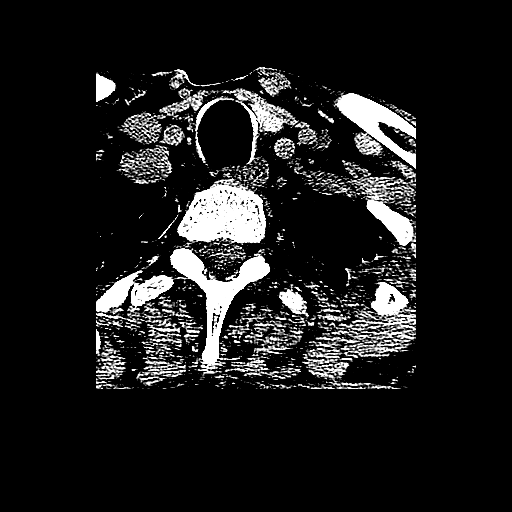
[im 9/91  bone]
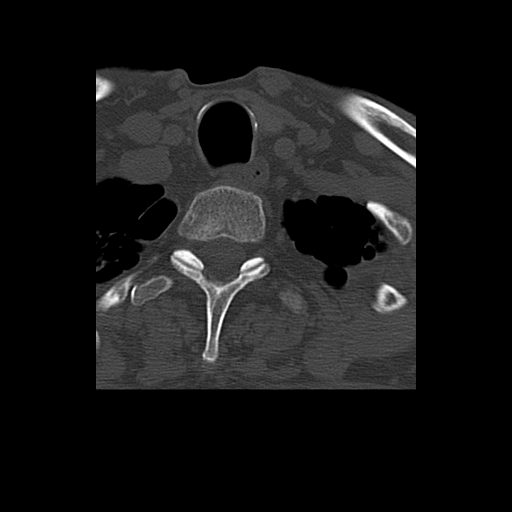
[im 17/91  brain]
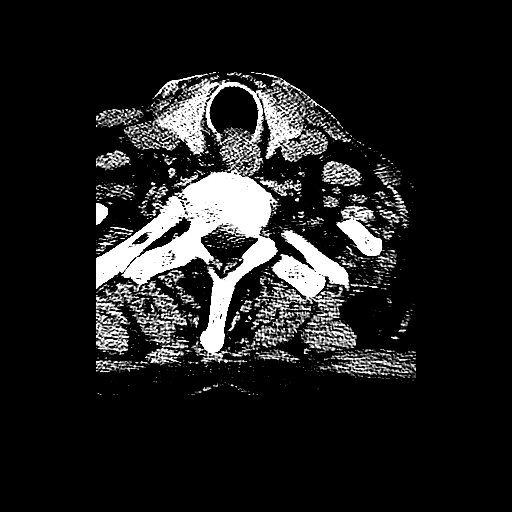
[im 25/91  brain]
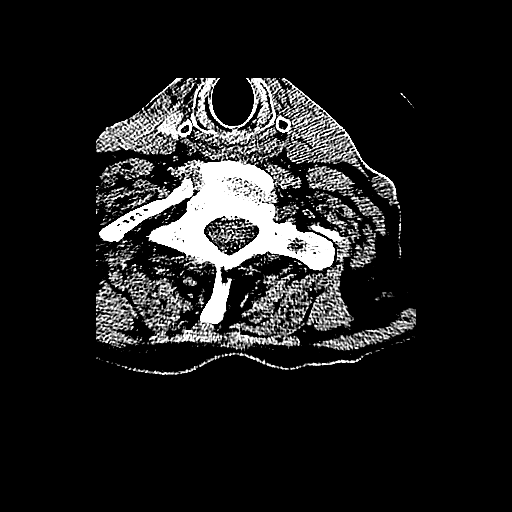
[im 33/91  brain]
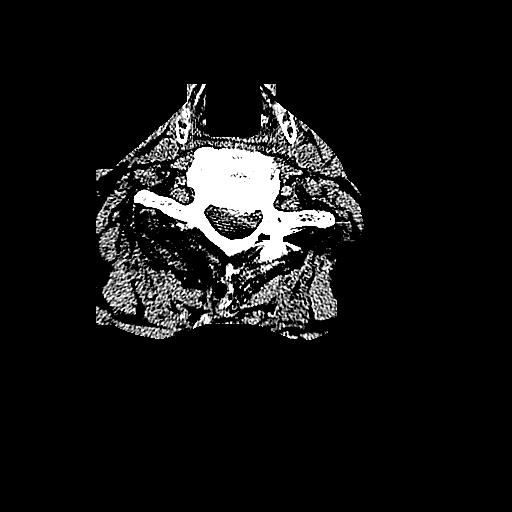
[im 41/91  brain]
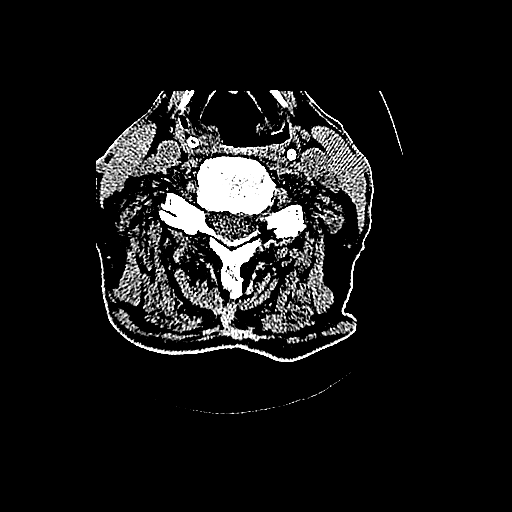
[im 41/91  bone]
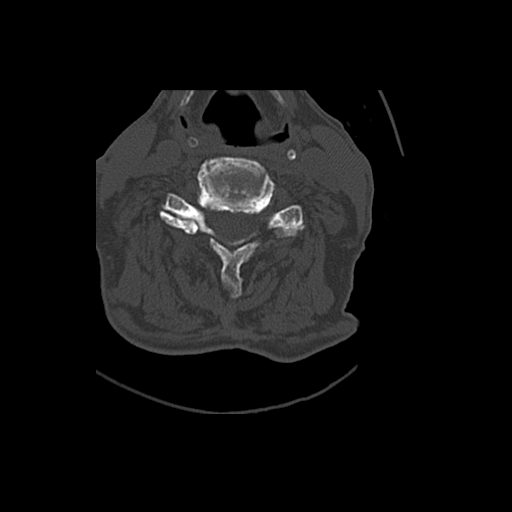
[im 50/91  brain]
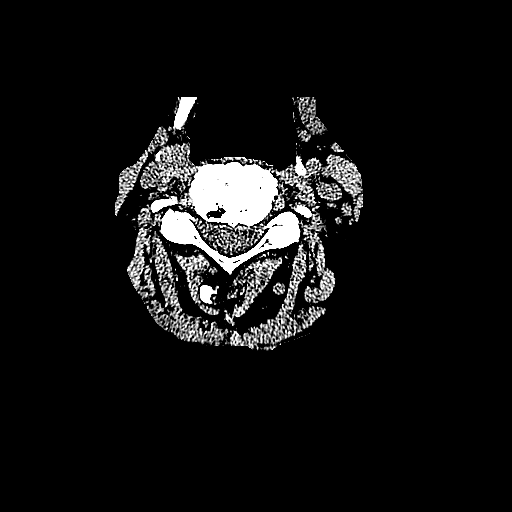
[im 58/91  brain]
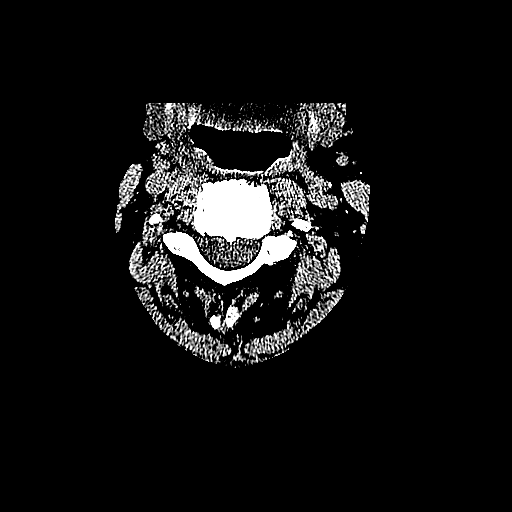
[im 66/91  brain]
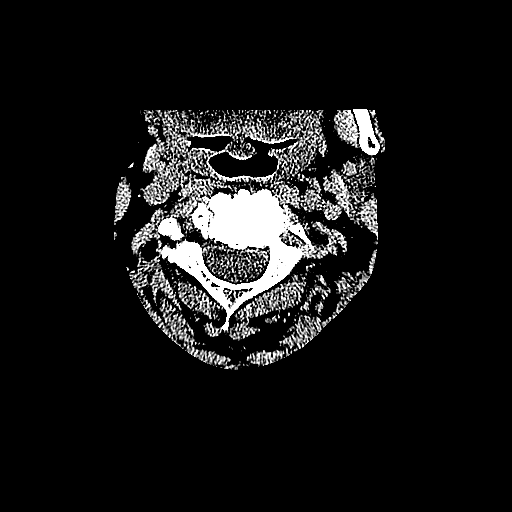
[im 74/91  brain]
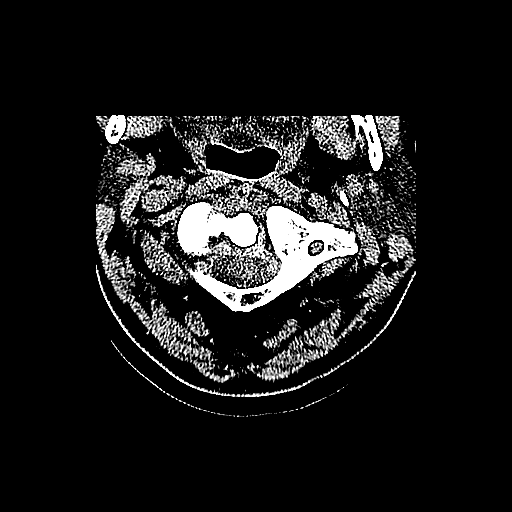
[im 74/91  bone]
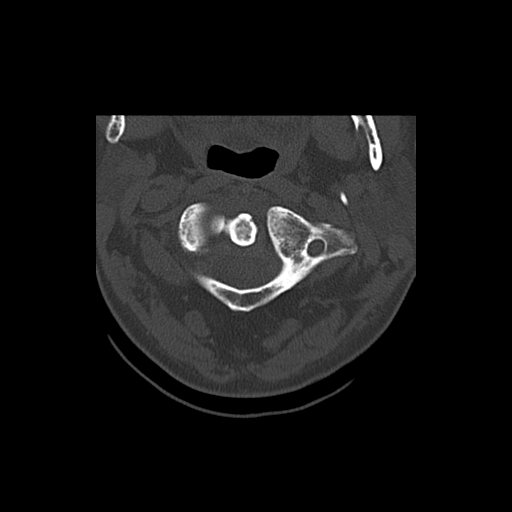
[im 82/91  brain]
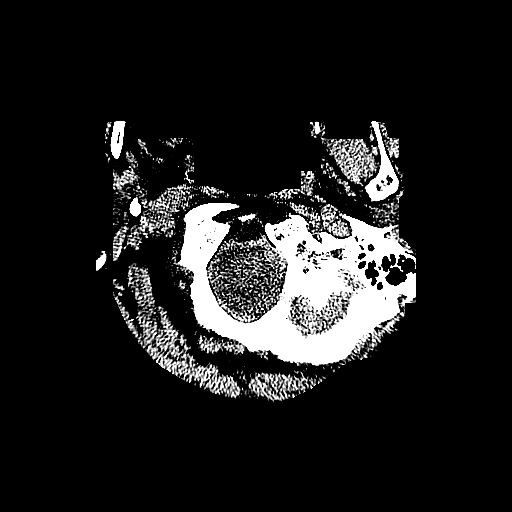

[13 of 40 positions shown; findings below may reference images not displayed]

FINDINGS: Postoperative changes to the proximal right humerus seen
on the scout view. Postoperative changes to the orbits.  No focal
scalp soft tissue injury identified. Calcified atherosclerosis at
the skull base.  Visualized paranasal sinuses and mastoids are
clear.  No acute osseous abnormality identified.  Occasional dural
calcifications. Cerebral volume is within normal limits for age.
Ventricular size and configuration are within normal limits.  No
midline shift or mass effect. No evidence of acute cortically based
infarct identified.  No acute intracranial hemorrhage identified.
Minor cerebral white matter hypodensity, otherwise gray-white
matter differentiation is within normal limits throughout the
brain.  No suspicious intracranial vascular hyperdensity.
IMPRESSION: 1. Unremarkable noncontrast appearance of the brain for age. No
acute traumatic injury identified.
2.  See cervical findings below.

CT CERVICAL SPINE
FINDINGS: Significant biapical pleural/parenchymal scarring
including partially calcified confluent lesion in the left apex.
Associated paraseptal, and central lobular emphysema.  Small
surgical clips seen posterior to the C6 and C7 vertebrae of
uncertain significance.  Calcified atherosclerosis of the right
carotid bifurcation and proximal right internal carotid.  Other
visualized paraspinal soft tissues are within normal limits.

Straightening of cervical lordosis. Visualized skull base is
intact.  No atlanto-occipital dissociation.  Atlantodens interval
is at the upper limits of normal which may represent degenerative
ligamentous laxity.  C1-C2 alignment is otherwise within normal
limits, and these levels appear intact.  Advanced multilevel
degenerative changes throughout the cervical spine with severe loss
of disc space height at C2-C3, and to a lesser extent at other
levels.  Bilateral posterior element degenerative hypertrophy and
uncovertebral hypertrophy.  Combined, there is diffuse moderate and
severe cervical spine neural foraminal stenosis and multilevel
spinal stenosis which is at least moderate at the C4-C5, C3-C4, and
C5-C6 levels.  At least mild spinal stenosis at C6-C7.  Despite
these changes, no spondylolisthesis. Bilateral posterior element
alignment is within normal limits.  Cervicothoracic junction
alignment is within normal limits.  Visualized upper thoracic
vertebra for a appear intact.  Occasional degenerative geodes.  No
acute fracture identified.
IMPRESSION: 1. No acute fracture or listhesis identified in the cervical spine.
Ligamentous injury is not excluded.
2.  Advanced diffuse cervical degenerative changes with at least
moderate spinal stenosis at C3-C4, C4-C5 and C5-C6.  Diffuse
moderate and severe cervical neural foraminal stenosis.
3.  Apical chronic lung disease including scarring and emphysema.

## 2010-09-28 ENCOUNTER — Encounter: Payer: Medicare Other | Admitting: Physical Medicine & Rehabilitation

## 2010-10-02 ENCOUNTER — Other Ambulatory Visit: Payer: Self-pay | Admitting: Neurosurgery

## 2010-10-02 DIAGNOSIS — M48061 Spinal stenosis, lumbar region without neurogenic claudication: Secondary | ICD-10-CM

## 2010-10-02 DIAGNOSIS — M479 Spondylosis, unspecified: Secondary | ICD-10-CM

## 2010-10-06 ENCOUNTER — Ambulatory Visit
Admission: RE | Admit: 2010-10-06 | Discharge: 2010-10-06 | Disposition: A | Discharge status: Home / Self Care | Payer: Medicare Other | Source: Ambulatory Visit | Attending: Neurosurgery | Admitting: Neurosurgery

## 2010-10-06 DIAGNOSIS — M48061 Spinal stenosis, lumbar region without neurogenic claudication: Secondary | ICD-10-CM

## 2010-10-06 DIAGNOSIS — M479 Spondylosis, unspecified: Secondary | ICD-10-CM

## 2011-03-26 DIAGNOSIS — K861 Other chronic pancreatitis: Secondary | ICD-10-CM | POA: Insufficient documentation

## 2011-03-26 DIAGNOSIS — K7030 Alcoholic cirrhosis of liver without ascites: Secondary | ICD-10-CM | POA: Insufficient documentation

## 2011-03-26 DIAGNOSIS — M48061 Spinal stenosis, lumbar region without neurogenic claudication: Secondary | ICD-10-CM | POA: Insufficient documentation

## 2011-06-01 DIAGNOSIS — F17200 Nicotine dependence, unspecified, uncomplicated: Secondary | ICD-10-CM | POA: Insufficient documentation

## 2011-06-05 ENCOUNTER — Other Ambulatory Visit: Payer: Self-pay | Admitting: Physical Medicine & Rehabilitation

## 2011-07-19 DIAGNOSIS — F419 Anxiety disorder, unspecified: Secondary | ICD-10-CM | POA: Insufficient documentation

## 2011-08-17 DIAGNOSIS — E871 Hypo-osmolality and hyponatremia: Secondary | ICD-10-CM | POA: Insufficient documentation

## 2011-08-17 DIAGNOSIS — D649 Anemia, unspecified: Secondary | ICD-10-CM | POA: Insufficient documentation

## 2011-08-20 DIAGNOSIS — E119 Type 2 diabetes mellitus without complications: Secondary | ICD-10-CM | POA: Insufficient documentation

## 2011-12-25 ENCOUNTER — Other Ambulatory Visit: Payer: Self-pay | Admitting: Physical Medicine & Rehabilitation

## 2012-07-16 ENCOUNTER — Other Ambulatory Visit: Payer: Self-pay | Admitting: Physical Medicine & Rehabilitation

## 2012-07-19 ENCOUNTER — Other Ambulatory Visit: Payer: Self-pay | Admitting: Physical Medicine & Rehabilitation

## 2012-08-06 ENCOUNTER — Other Ambulatory Visit: Payer: Self-pay | Admitting: Physical Medicine & Rehabilitation

## 2012-08-29 ENCOUNTER — Other Ambulatory Visit: Payer: Self-pay | Admitting: Physical Medicine & Rehabilitation

## 2014-07-10 DIAGNOSIS — I10 Essential (primary) hypertension: Secondary | ICD-10-CM | POA: Insufficient documentation

## 2014-12-13 DIAGNOSIS — J410 Simple chronic bronchitis: Secondary | ICD-10-CM | POA: Insufficient documentation

## 2015-10-01 ENCOUNTER — Ambulatory Visit (INDEPENDENT_AMBULATORY_CARE_PROVIDER_SITE_OTHER): Payer: Medicare Other | Admitting: Psychiatry

## 2015-10-01 ENCOUNTER — Encounter (HOSPITAL_COMMUNITY): Payer: Self-pay | Admitting: Psychiatry

## 2015-10-01 VITALS — BP 136/80 | HR 76 | Ht 72.0 in | Wt 104.0 lb

## 2015-10-01 DIAGNOSIS — G4701 Insomnia due to medical condition: Secondary | ICD-10-CM

## 2015-10-01 DIAGNOSIS — F1020 Alcohol dependence, uncomplicated: Secondary | ICD-10-CM | POA: Diagnosis not present

## 2015-10-01 DIAGNOSIS — F4323 Adjustment disorder with mixed anxiety and depressed mood: Secondary | ICD-10-CM

## 2015-10-01 NOTE — Patient Instructions (Addendum)
Patient tought he is visiting pain clinic . Is not interested to follow or visit psychiatry. He needs pain management for his back . His pain keeps him up and upset him.  He continues to drink half a gallon per 3-4 days. Says cannot stop till his pain is managed.  Understands the risk and that he has cirrhosis of liver He takes xanax 0.25mg  2 tablets at night for sleep. Small dose but it helps says cannot cut down and understands the risk. Wants pain management  Keeps himself busy with his work. Cymbalta and remeron helpful for mood/sleep and anxiety. Can continue Patient does not want to follow up with psychiatry and he can be further managed by primary care, more so with pain clinic/orthopedics Not interested in rehab or to cut down on alcohol.

## 2015-10-01 NOTE — Progress Notes (Signed)
Psychiatric Initial Adult Assessment   Patient Identification: Andres Cochran MRN:  [id number] Date of Evaluation:  10/01/2015 Referral Source: Vicente Males, PA Chief Complaint:   Chief Complaint    Establish Care     Visit Diagnosis:    ICD-9-CM ICD-10-CM   1. Adjustment disorder with mixed anxiety and depressed mood 309.28 F43.23   2. Insomnia due to medical condition 327.01 G47.01   3. Alcohol use disorder, moderate, dependence (HCC) 303.90 F10.20     History of Present Illness:  75 years old currently single Caucasian male living with his sister and brother-in-law.  Referred by  primary care physician for possible management of anxiety/sleep. Patient talked that this is a pain clinic's main concern chief complaint remains pain that affects her sleep affects his mood at night. He continues to drink apple half a gallon in 3-4 days says that he cannot stop alcohol despite understanding that he has cirrhosis of liver. His alcohol according to him helps his pain he has had back surgery for overdoses for the last 1-1/2 year he still continues to suffer from pain cannot sleep. He also takes Xanax 0.25 mg 2 tablets at night he is not taking the daytime dose. Says it helps him sleep he understands that alcohol can make his cirrhosis worse. He says there is over that he can quit alcohol or Xanax because he suffers from so much pain that it is not possible to function without alcohol. Says that his orthopedic doctor did not elaborate for any other further surgery and is not happy with his pain management and medications. Says that he has no other choice but to drink. Despite drinking he still continues to function he has a Higher education careers adviser business. He has had other businesses in the past currently is also retired from an Technical brewer. He has 2 grown sons  Regarding his depression does not enroll his depression he said hell no I dont want to be  depressed I want to remain active. Does  not endorse anxiety says he worries about his pain and his pain affects his mood his pain affects her sleep he is on Cymbalta and Remeron for depression pain and sleep. Because of his lordosis and back surgery his pain is complicated he keeps himself active and alert during the day does not take Xanax during the day he takes small dose of Xanax at night.  No psychosis.  No mania No other drug use, infequent use of marijuana at rare occassions " i tell you it should be allowed" Aggravating factor: back pain, back surgery, multiple medical conditions. Continues to drink. Divorced or seperated Modifying factor: his sister, his work.   Associated Signs/Symptoms: Depression Symptoms:  insomnia, anxiety, disturbed sleep, weight loss, (Hypo) Manic Symptoms:  Distractibility, Anxiety Symptoms:  Excessive Worry, Psychotic Symptoms:  denies PTSD Symptoms: NA  Past Psychiatric History: None. No Hospitalization or suicide attempt  Previous Psychotropic Medications: No   Substance Abuse History in the last 12 months:  Yes.    Alcohol. Used to drink beer after work few to 6. Now half a gallon liqour in 3-4 days.  Poor insight. Says cannot stop till his pain is managed. Medical consequeneces: has cirrhosis of liver  Consequences of Substance Abuse: Medical Consequences:  cirrhossis of liver  Past Medical History: No past medical history on file. No past surgical history on file.  Family Psychiatric History: not aware.   Family History: No family history on file.  Social History:   Social  History   Social History  . Marital Status: Single    Spouse Name: N/A  . Number of Children: N/A  . Years of Education: N/A   Social History Main Topics  . Smoking status: Current Every Day Smoker -- 0.50 packs/day for 60 years    Types: Cigarettes  . Smokeless tobacco: None  . Alcohol Use: 14.4 oz/week    24 Shots of liquor per week  . Drug Use: 1.00 per week    Special: Marijuana  . Sexual  Activity: Not Asked   Other Topics Concern  . None   Social History Narrative  . 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 He quit school in 8th grade.  Married 2 times. First ran away with neighbour. Second was too demanding and accused her. He left her then Has 2 grown sons.  Retired.    Allergies:   Allergies  Allergen Reactions  . Codeine Swelling    HAD THIS AT AGE 75 AND, FACIAL SWELLING AND THROAT CLOSED.  . Nsaids Other (See Comments)    GI ulcers    Metabolic Disorder Labs: No results found for: HGBA1C, MPG No results found for: PROLACTIN No results found for: CHOL, TRIG, HDL, CHOLHDL, VLDL, LDLCALC   Current Medications: Current Outpatient Prescriptions  Medication Sig Dispense Refill  . DULoxetine (CYMBALTA) 60 MG capsule Take 60 mg by mouth daily.  1  . gabapentin (NEURONTIN) 300 MG capsule Take 1 capsule in the morning and 1 capsule in the afternoon. Take 2 capsules at bedtime  0  . lisinopril (PRINIVIL,ZESTRIL) 10 MG tablet Take 10 mg by mouth daily.  1  . lisinopril-hydrochlorothiazide (PRINZIDE,ZESTORETIC) 10-12.5 MG tablet     . mirtazapine (REMERON) 30 MG tablet Take 30 mg by mouth at bedtime.  2  . nortriptyline (PAMELOR) 25 MG capsule TAKE 1 TO 2 CAPSULES BY MOUTH AT BEDTIME 60 capsule 5  . omeprazole (PRILOSEC) 40 MG capsule      No current facility-administered medications for this visit.    Neurologic: Headache: No Seizure: No Paresthesias:Yes  Musculoskeletal: Strength & Muscle Tone: increased Gait & Station: broad based Patient leans: Front  Psychiatric Specialty Exam: Review of Systems  Cardiovascular: Negative for chest pain.  Musculoskeletal: Positive for back pain and joint pain.  Skin: Negative for rash.  Neurological: Negative for tremors.  Psychiatric/Behavioral: Positive for substance abuse. The patient has insomnia.      Blood pressure 136/80, pulse 76, height 6' (1.829 m), weight 104 lb (47.174 kg), SpO2 96 %.Body mass index is 14.1 kg/(m^2).  General Appearance: Casual. Thinly build   Eye Contact:  Fair  Speech:  Normal Rate  Volume:  Decreased  Mood:  some distress due to pain  Affect:  Congruent  Thought Process:  Goal Directed  Orientation:  Full (Time, Place, and Person)  Thought Content:  Rumination  Suicidal Thoughts:  No  Homicidal Thoughts:  No  Memory:  Immediate;   Fair Recent;   Fair  Judgement:  Poor  Insight:  Shallow  Psychomotor Activity:  Decreased  Concentration:  Concentration: Fair and Attention Span: Fair  Recall:  [employer]Fair  Fund of Knowledge:Fair  Language: Fair  Akathisia:  Negative  Handed:  Right  AIMS (if indicated):    Assets:  Desire for Improvement  ADL's:  Intact  Cognition: WNL  Sleep:  Variable to poor  Treatment Plan Summary: Medication management and Plan as follows  Anxiety: possible related to pain and cant sleep and adjust body at night. Reviewed sleep hygiene.  Does not want to stop alcohol. Says he cannot till he gets his pain in control.  Takes remeron and cymbalta.  He is not aware that he is following up with a psychiatrist who thought this was pain management clinic Insomnia; review sleep hygiene he consisting Xanax small dose of 0.25 mg 2 tablets at night he is not taking any during the day. Says that it does help him sleep is still take some time falling asleep he understands the risk of being on this medication and combining with alcohol but says hell no I cannot do anything because I suffer from some was pain as the only way that I can follow sleep. He understands the risk of alcohol and he having cirrhosis of liver. Despite that he still tries to continue to be active and work in his shop Alcohol dependence; not ready to quit counseling and referrals reviewed. Says that alcohol  helps his pain. He otherwise is not feeling hopeless or depressed  and does not feel need to follow with psychiatry  His meds can be managed by primary care.  More then  50% of time spent in counseling and coordination of care including patient education. He understands to call 911 or report to medical emergency room for any urgent concerns or suicidal thoughts Patient does not plan to make any follow-up appointment at this clinic. All questions were answered    Thresa Ross, MD 6/21/20179:30 AM

## 2016-12-11 DEATH — deceased
